# Patient Record
Sex: Male | Born: 1995 | Race: Black or African American | Hispanic: No | Marital: Single | Smoking: Never smoker
Health system: Southern US, Community
[De-identification: ages and names within clinical notes are randomized; demographics above are authoritative.]

## PROBLEM LIST (undated history)

## (undated) DIAGNOSIS — J45909 Unspecified asthma, uncomplicated: Secondary | ICD-10-CM

## (undated) HISTORY — DX: Unspecified asthma, uncomplicated: J45.909

## (undated) HISTORY — PX: NO PAST SURGERIES: SHX2092

---
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
## 2017-02-01 DIAGNOSIS — Z23 Encounter for immunization: Secondary | ICD-10-CM | POA: Diagnosis not present

## 2019-04-23 ENCOUNTER — Ambulatory Visit: Payer: HRSA Program | Attending: Internal Medicine

## 2019-04-23 DIAGNOSIS — Z20828 Contact with and (suspected) exposure to other viral communicable diseases: Secondary | ICD-10-CM | POA: Diagnosis not present

## 2019-04-23 DIAGNOSIS — Z20822 Contact with and (suspected) exposure to covid-19: Secondary | ICD-10-CM

## 2019-04-24 LAB — NOVEL CORONAVIRUS, NAA: SARS-CoV-2, NAA: NOT DETECTED

## 2019-08-22 ENCOUNTER — Ambulatory Visit: Payer: Self-pay | Attending: Internal Medicine

## 2019-08-22 DIAGNOSIS — Z23 Encounter for immunization: Secondary | ICD-10-CM

## 2019-08-22 NOTE — Progress Notes (Signed)
   Covid-19 Vaccination Clinic  Name:  Nicko Daher    MRN: [id number] DOB: 06/12/1995  08/22/2019  Mr. Danner was observed post Covid-19 immunization for 15 minutes without incident. He was provided with Vaccine Information Sheet and instruction to access the V-Safe system.   Mr. Venhuizen was instructed to call 911 with any severe reactions post vaccine: Marland Kitchen Difficulty breathing  . Swelling of face and throat  . A fast heartbeat  . A bad rash all over body  . Dizziness and weakness   Immunizations Administered    Name Date Dose VIS Date Route   Pfizer COVID-19 Vaccine 08/22/2019  2:30 PM 0.3 mL 06/25/2018 Intramuscular   Manufacturer: [employer], [employer]   Lot: [id number]   NDC: [address]-2

## 2019-09-13 ENCOUNTER — Ambulatory Visit: Payer: Self-pay | Attending: Internal Medicine

## 2019-09-13 DIAGNOSIS — Z23 Encounter for immunization: Secondary | ICD-10-CM

## 2019-09-13 NOTE — Progress Notes (Signed)
   Covid-19 Vaccination Clinic  Name:  [name]    MRN: [id number] DOB: 10-22-1995  09/13/2019  [name] was observed post Covid-19 immunization for 15 minutes without incident. He was provided with Vaccine Information Sheet and instruction to access the V-Safe system.   [name] was instructed to call 911 with any severe reactions post vaccine: Marland Kitchen Difficulty breathing  . Swelling of face and throat  . A fast heartbeat  . A bad rash all over body  . Dizziness and weakness   Immunizations Administered    Name Date Dose VIS Date Route   Pfizer COVID-19 Vaccine 09/13/2019 10:41 AM 0.3 mL 06/25/2018 Intramuscular   Manufacturer: [employer], [employer]   Lot: [id number]   NDC: [address]-9

## 2019-09-15 ENCOUNTER — Ambulatory Visit: Payer: Self-pay

## 2019-09-18 ENCOUNTER — Ambulatory Visit: Payer: Self-pay

## 2021-11-14 ENCOUNTER — Encounter: Payer: Self-pay | Admitting: Nurse Practitioner

## 2021-11-14 ENCOUNTER — Ambulatory Visit (INDEPENDENT_AMBULATORY_CARE_PROVIDER_SITE_OTHER): Payer: No Typology Code available for payment source | Admitting: Nurse Practitioner

## 2021-11-14 VITALS — BP 118/72 | HR 73 | Temp 96.5°F | Resp 10 | Ht 66.25 in | Wt 217.1 lb

## 2021-11-14 DIAGNOSIS — Z0000 Encounter for general adult medical examination without abnormal findings: Secondary | ICD-10-CM

## 2021-11-14 DIAGNOSIS — N5089 Other specified disorders of the male genital organs: Secondary | ICD-10-CM | POA: Diagnosis not present

## 2021-11-14 DIAGNOSIS — E6609 Other obesity due to excess calories: Secondary | ICD-10-CM | POA: Diagnosis not present

## 2021-11-14 DIAGNOSIS — Z6834 Body mass index (BMI) 34.0-34.9, adult: Secondary | ICD-10-CM

## 2021-11-14 NOTE — Progress Notes (Signed)
New Patient Office Visit  Subjective    Patient ID: Chad Fernandez, male    DOB: 07-Sep-1995  Age: 26 y.o. MRN: [id number]  CC:  Chief Complaint  Patient presents with   Establish Care    Previous PCP with Blair Heys at [facility].   Annual Exam    Has been told he was slightly anemic and had a slight elevation of cholesterol    HPI Chad Fernandez presents to establish care   Bariatric clinic in [place]: they have him on Diethylpropion. Sees them once a month.  States has been seeing them for approximately 1 month.  States he is doing well on medication.  Denies any adverse drug events.   for complete physical and follow up of chronic conditions.  Immunizations: -Tetanus:2018 -Influenza: out of season  -Covid-19: Pfizerx2 -Shingles: too young -Pneumonia: too young  -HPV: UTD  Diet: Fair diet. 2 meals ad ay with some snacking (2). Snacks will be healthy and unhealhty. Sugar free soda, powerade. Will drink some water. Exercise: Goes to the gym 3 times will do cardio for 30 mins.   Eye exam: Completes annually. Saw them last week. Wears glasses. [facility]. Dental exam: Completes semi-annually   Colonoscopy: Too young, currently average risk  Lung Cancer Screening: NA Dexa: NA  PSA: Dad had something with the prostate. Does not think it is cancer  Sleep: Works 3rd shift. States that he will 4 hours two times a day. Feels rested. Does not snore.  Lives with parents, Mom and dad. No siblings Not Sexually active but interested in women. Discussed safe sex practices.      Outpatient Encounter Medications as of 11/14/2021  Medication Sig   Diethylpropion HCl CR 75 MG TB24 Take 1 tablet by mouth daily.   [DISCONTINUED] amoxicillin (AMOXIL) 500 MG capsule Take 1 capsule (500 mg total) by mouth 3 (three) times daily.   [DISCONTINUED] cetirizine (ZYRTEC) 10 MG tablet Take 1 tablet (10 mg total) by mouth daily. One tab daily for allergies   [DISCONTINUED] ipratropium  (ATROVENT) 0.06 % nasal spray Place 2 sprays into both nostrils 4 (four) times daily.   No facility-administered encounter medications on file as of 11/14/2021.    Past Medical History:  Diagnosis Date   Asthma     Past Surgical History:  Procedure Laterality Date   NO PAST SURGERIES      Family History  Problem Relation Age of Onset   Obesity Mother    Anemia Mother    Asthma Father    Obesity Father    Diabetes Paternal Grandmother    Cataracts Paternal Grandmother     Social History   Socioeconomic History   Marital status: Single    Spouse name: Not on file   Number of children: 0   Years of education: Not on file   Highest education level: Bachelor's degree (e.g., BA, AB, BS)  Occupational History   Not on file  Tobacco Use   Smoking status: Never    Passive exposure: Never   Smokeless tobacco: Never  Vaping Use   Vaping Use: Never used  Substance and Sexual Activity   Alcohol use: Never   Drug use: Never   Sexual activity: Never  Other Topics Concern   Not on file  Social History Narrative   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  Respiratory:  Negative for shortness of breath.   Cardiovascular:  Negative for chest pain, palpitations and leg swelling.  Gastrointestinal:  Negative for abdominal pain, blood in stool, constipation, diarrhea, nausea and vomiting.       BM Every other day   Genitourinary:  Negative for dysuria and hematuria.  Neurological:  Negative for tingling and headaches.  Psychiatric/Behavioral:  Negative for hallucinations and suicidal ideas.         Objective    BP 118/72   Pulse  73   Temp (!) 96.5 F (35.8 C)   Resp 10   Ht 5' 6.25" (1.683 m)   Wt 217 lb 2 oz (98.5 kg)   SpO2 97%   BMI 34.78 kg/m   Physical Exam Vitals and nursing note reviewed. Exam conducted with a chaperone present [facility], RMA).  Constitutional:      Appearance: Normal appearance. He is obese.  HENT:     Right Ear: Tympanic membrane, ear canal and external ear normal.     Left Ear: Tympanic membrane, ear canal and external ear normal.     Mouth/Throat:     Mouth: Mucous membranes are moist.     Pharynx: Oropharynx is clear.  Eyes:     Extraocular Movements: Extraocular movements intact.     Pupils: Pupils are equal, round, and reactive to light.     Comments: Wears glasses  Cardiovascular:     Rate and Rhythm: Normal rate and regular rhythm.     Pulses: Normal pulses.     Heart sounds: Normal heart sounds.  Pulmonary:     Effort: Pulmonary effort is normal.     Breath sounds: Normal breath sounds.  Abdominal:     General: Bowel sounds are normal. There is no distension.     Palpations: There is no mass.     Tenderness: There is no abdominal tenderness.     Hernia: No hernia is present. There is no hernia in the left inguinal area or right inguinal area.  Genitourinary:    Penis: Normal and uncircumcised.      Testes:        Right: Mass present. Tenderness not present.        Left: Mass not present.     Epididymis:     Right: Normal.     Left: Normal.  Musculoskeletal:     Right lower leg: No edema.     Left lower leg: No edema.  Lymphadenopathy:     Cervical: No cervical adenopathy.     Lower Body: No right inguinal adenopathy. No left inguinal adenopathy.  Skin:    General: Skin is warm.  Neurological:     General: No focal deficit present.     Mental Status: He is alert.     Deep Tendon Reflexes:     Reflex Scores:      Bicep reflexes are 2+ on the right side and 2+ on the left side.      Patellar reflexes are 2+ on the right side and 2+ on the left  side.    Comments: Bilateral upper and lower extremity strength 5/5  Psychiatric:        Mood and Affect: Mood normal.  Behavior: Behavior normal.        Thought Content: Thought content normal.        Judgment: Judgment normal.         Assessment & Plan:   Problem List Items Addressed This Visit       Other   Scrotal mass    Incidental finding on exam.  Patient states sometimes it hurts sometimes does not.  He has not had it formally evaluated with ultrasound.  Did place order and give patient information to call to set up at [facility] imaging on [address]  Discussed the importance of getting ultrasound with his age to make sure it is not testicular cancer      Relevant Orders   US SCROTUM W/DOPPLER   Class 1 obesity due to excess calories without serious comorbidity with body mass index (BMI) of 34.0 to 34.9 in adult    Encouraged healthy lifestyle modifications.  Patient currently seeing bariatric clinic in [place].  Continue following up with them as scheduled continue take medication as prescribed by them.      Relevant Medications   Diethylpropion HCl CR 75 MG TB24   Other Relevant Orders   TSH   Lipid panel   Hemoglobin A1c   Preventative health care - Primary    Discussed age-appropriate immunizations and screening exams.      Relevant Orders   TSH   Lipid panel   Hemoglobin A1c   Comprehensive metabolic panel   CBC    Return in about 1 year (around 11/15/2022) for CPE and labs.   Audria Nine, NP

## 2021-11-14 NOTE — Assessment & Plan Note (Signed)
Discussed age-appropriate immunizations and screening exams. 

## 2021-11-14 NOTE — Assessment & Plan Note (Signed)
Encouraged healthy lifestyle modifications.  Patient currently seeing bariatric clinic in [place].  Continue following up with them as scheduled continue take medication as prescribed by them.

## 2021-11-14 NOTE — Patient Instructions (Signed)
Nice to see you today I will be in touch with the lab results once I have reviewed them Call the number I gave you and get the Ultrasound set up. Once I have the results I will be in touch with you. I want to see you in 1 year for your next physical, sooner if you need me

## 2021-11-14 NOTE — Assessment & Plan Note (Signed)
Incidental finding on exam.  Patient states sometimes it hurts sometimes does not.  He has not had it formally evaluated with ultrasound.  Did place order and give patient information to call to set up at [facility] imaging on [address]  Discussed the importance of getting ultrasound with his age to make sure it is not testicular cancer

## 2021-11-15 LAB — LIPID PANEL
Chol/HDL Ratio: 4.5 ratio (ref 0.0–5.0)
Cholesterol, Total: 162 mg/dL (ref 100–199)
HDL: 36 mg/dL — ABNORMAL LOW (ref >39)
LDL Chol Calc (NIH): 117 mg/dL — ABNORMAL HIGH (ref 0–99)
Triglycerides: 43 mg/dL (ref 0–149)
VLDL Cholesterol Cal: 9 mg/dL (ref 5–40)

## 2021-11-15 LAB — CBC
Hematocrit: 45 % (ref 37.5–51.0)
Hemoglobin: 14.4 g/dL (ref 13.0–17.7)
MCH: 22.8 pg — ABNORMAL LOW (ref 26.6–33.0)
MCHC: 32 g/dL (ref 31.5–35.7)
MCV: 71 fL — ABNORMAL LOW (ref 79–97)
Platelets: 237 10*3/uL (ref 150–450)
RBC: 6.31 x10E6/uL — ABNORMAL HIGH (ref 4.14–5.80)
RDW: 15.6 % — ABNORMAL HIGH (ref 11.6–15.4)
WBC: 3.9 10*3/uL (ref 3.4–10.8)

## 2021-11-15 LAB — COMPREHENSIVE METABOLIC PANEL
ALT: 27 IU/L (ref 0–44)
AST: 25 IU/L (ref 0–40)
Albumin/Globulin Ratio: 1.5 (ref 1.2–2.2)
Albumin: 4.6 g/dL (ref 4.3–5.2)
Alkaline Phosphatase: 56 IU/L (ref 44–121)
BUN/Creatinine Ratio: 12 (ref 9–20)
BUN: 13 mg/dL (ref 6–20)
Bilirubin Total: 0.4 mg/dL (ref 0.0–1.2)
CO2: 25 mmol/L (ref 20–29)
Calcium: 9.6 mg/dL (ref 8.7–10.2)
Chloride: 100 mmol/L (ref 96–106)
Creatinine, Ser: 1.05 mg/dL (ref 0.76–1.27)
Globulin, Total: 3 g/dL (ref 1.5–4.5)
Glucose: 89 mg/dL (ref 70–99)
Potassium: 4.2 mmol/L (ref 3.5–5.2)
Sodium: 140 mmol/L (ref 134–144)
Total Protein: 7.6 g/dL (ref 6.0–8.5)
eGFR: 102 mL/min/{1.73_m2} (ref >59)

## 2021-11-15 LAB — HEMOGLOBIN A1C
Est. average glucose Bld gHb Est-mCnc: 111 mg/dL
Hgb A1c MFr Bld: 5.5 % (ref 4.8–5.6)

## 2021-11-15 LAB — TSH: TSH: 2.07 u[IU]/mL (ref 0.450–4.500)

## 2021-11-19 ENCOUNTER — Other Ambulatory Visit: Payer: Self-pay

## 2021-11-19 ENCOUNTER — Encounter: Payer: Self-pay | Admitting: Emergency Medicine

## 2021-11-19 ENCOUNTER — Ambulatory Visit
Admission: EM | Admit: 2021-11-19 | Discharge: 2021-11-19 | Disposition: A | Discharge status: Home / Self Care | Payer: No Typology Code available for payment source | Attending: Family Medicine | Admitting: Family Medicine

## 2021-11-19 DIAGNOSIS — J0140 Acute pansinusitis, unspecified: Secondary | ICD-10-CM | POA: Diagnosis not present

## 2021-11-19 DIAGNOSIS — J45901 Unspecified asthma with (acute) exacerbation: Secondary | ICD-10-CM

## 2021-11-19 MED ORDER — ALBUTEROL SULFATE HFA 108 (90 BASE) MCG/ACT IN AERS: 2.0000 | INHALATION_SPRAY | RESPIRATORY_TRACT | 0 refills | Status: DC | PRN

## 2021-11-19 MED ORDER — AMOXICILLIN 875 MG PO TABS: 875.0000 mg | ORAL_TABLET | Freq: Two times a day (BID) | ORAL | 0 refills | Status: DC

## 2021-11-19 MED ORDER — ALBUTEROL SULFATE HFA 108 (90 BASE) MCG/ACT IN AERS
2.0000 | INHALATION_SPRAY | Freq: Once | RESPIRATORY_TRACT | Status: AC
  Administered 2021-11-19: 2 via RESPIRATORY_TRACT

## 2021-11-19 MED ORDER — PROMETHAZINE-DM 6.25-15 MG/5ML PO SYRP: 5.0000 mL | ORAL_SOLUTION | Freq: Three times a day (TID) | ORAL | 0 refills | Status: DC | PRN

## 2021-11-19 NOTE — ED Provider Notes (Signed)
Renaldo Fiddler    CSN: [id number] Arrival date & time: 11/19/21  1357      History   Chief Complaint Chief Complaint  Patient presents with   Cough    HPI Chad Fernandez is a 26 y.o. male.   HPI Patient with a history of asthma presents today with cough which has been occasionally productive, severe nasal congestion, runny nose x10 days.  He has taken multiple over-the-counter medications without relief of symptoms.  He denies any chest pain or shortness of breath. Endorses mild wheezing throughout the course of current illness. No known sick contacts.  Past Medical History:  Diagnosis Date   Asthma     Patient Active Problem List   Diagnosis Date Noted   Scrotal mass 11/14/2021   Class 1 obesity due to excess calories without serious comorbidity with body mass index (BMI) of 34.0 to 34.9 in adult 11/14/2021   Preventative health care 11/14/2021    Past Surgical History:  Procedure Laterality Date   NO PAST SURGERIES         Home Medications    Prior to Admission medications   Medication Sig Start Date End Date Taking? Authorizing Provider  albuterol (VENTOLIN HFA) 108 (90 Base) MCG/ACT inhaler Inhale 2 puffs into the lungs every 4 (four) hours as needed for wheezing or shortness of breath. 11/19/21  Yes Bing Neighbors, FNP  amoxicillin (AMOXIL) 875 MG tablet Take 1 tablet (875 mg total) by mouth 2 (two) times daily. 11/19/21  Yes Bing Neighbors, FNP  Diethylpropion HCl CR 75 MG TB24 Take 1 tablet by mouth daily. 10/24/21  Yes [provider]  promethazine-dextromethorphan (PROMETHAZINE-DM) 6.25-15 MG/5ML syrup Take 5 mLs by mouth 3 (three) times daily as needed for cough. 11/19/21  Yes Bing Neighbors, FNP    Family History Family History  Problem Relation Age of Onset   Obesity Mother    Anemia Mother    Asthma Father    Obesity Father    Diabetes Paternal Grandmother    Cataracts Paternal Grandmother     Social History Social  History   Tobacco Use   Smoking status: Never    Passive exposure: Never   Smokeless tobacco: Never  Vaping Use   Vaping Use: Never used  Substance Use Topics   Alcohol use: Never   Drug use: Never     Allergies   Patient has no known allergies.   Review of Systems Review of Systems Pertinent negatives listed in HPI   Physical Exam Triage Vital Signs ED Triage Vitals  Enc Vitals Group     BP 11/19/21 1450 (!) 143/76     Pulse Rate 11/19/21 1450 76     Resp 11/19/21 1450 16     Temp 11/19/21 1450 98.2 F (36.8 C)     Temp Source 11/19/21 1450 Oral     SpO2 11/19/21 1450 98 %     Weight --      Height --      Head Circumference --      Peak Flow --      Pain Score 11/19/21 1452 0     Pain Loc --      Pain Edu? --      Excl. in GC? --    No data found.  Updated Vital Signs BP (!) 143/76 (BP Location: Right Arm)   Pulse 76   Temp 98.2 F (36.8 C) (Oral)   Resp 16   SpO2 98%  Visual Acuity Right Eye Distance:   Left Eye Distance:   Bilateral Distance:    Right Eye Near:   Left Eye Near:    Bilateral Near:     Physical Exam  General Appearance:    Alert, cooperative, no distress  HENT:   Normocephalic, ears normal, nares mucosal edema with congestion, rhinorrhea, oropharynx without erythema or exudate    Eyes:    PERRL, conjunctiva/corneas clear, EOM's intact       Lungs:     Clear to auscultation bilaterally, respirations unlabored  Heart:    Regular rate and rhythm  Neurologic:   Awake, alert, oriented x 3. No apparent focal neurological           defect.     UC Treatments / Results  Labs (all labs ordered are listed, but only abnormal results are displayed) Labs Reviewed - No data to display  EKG   Radiology No results found.  Procedures Procedures (including critical care time)  Medications Ordered in UC Medications  albuterol (VENTOLIN HFA) 108 (90 Base) MCG/ACT inhaler 2 puff (has no administration in time range)    Initial  Impression / Assessment and Plan / UC Course  I have reviewed the triage vital signs and the nursing notes.  Pertinent labs & imaging results that were available during my care of the patient were reviewed by me and considered in my medical decision making (see chart for details).    Treatment per discharge instructions and medication orders. Continue to hydrate well with fluids. Return here or follow-up with primary care provider symptoms worsen or do not readily improve.   Final Clinical Impressions(s) / UC Diagnoses   Final diagnoses:  Acute non-recurrent pansinusitis  Asthma with acute exacerbation, unspecified asthma severity, unspecified whether persistent     Discharge Instructions      Take medication as prescribed.  I have prescribed you an albuterol inhaler for management of wheezing, chest tightness or shortness of breath. If any of your symptoms worsen or do not readily improve follow-up with your primary care provider return for evaluation.     ED Prescriptions     Medication Sig Dispense Auth. Provider   amoxicillin (AMOXIL) 875 MG tablet Take 1 tablet (875 mg total) by mouth 2 (two) times daily. 20 tablet Bing Neighbors, FNP   promethazine-dextromethorphan (PROMETHAZINE-DM) 6.25-15 MG/5ML syrup Take 5 mLs by mouth 3 (three) times daily as needed for cough. 140 mL Bing Neighbors, FNP   albuterol (VENTOLIN HFA) 108 (90 Base) MCG/ACT inhaler Inhale 2 puffs into the lungs every 4 (four) hours as needed for wheezing or shortness of breath. 1 each Bing Neighbors, FNP      PDMP not reviewed this encounter.   Bing Neighbors, FNP 11/19/21 1517

## 2021-11-19 NOTE — ED Triage Notes (Signed)
Patient productive cough w/ " white" sputum x 10 days.   Patient denies fever or SOB.   Patient endorses nasal congestion.   Patient endorses runny.   Patient has taken Robitussin and Mucinex with no relief of symptoms.

## 2021-11-19 NOTE — Discharge Instructions (Addendum)
Take medication as prescribed.  I have prescribed you an albuterol inhaler for management of wheezing, chest tightness or shortness of breath. If any of your symptoms worsen or do not readily improve follow-up with your primary care provider return for evaluation.

## 2021-11-21 ENCOUNTER — Other Ambulatory Visit: Payer: No Typology Code available for payment source

## 2021-11-24 ENCOUNTER — Ambulatory Visit
Admission: RE | Admit: 2021-11-24 | Discharge: 2021-11-24 | Disposition: A | Discharge status: Home / Self Care | Payer: No Typology Code available for payment source | Source: Ambulatory Visit | Attending: Nurse Practitioner | Admitting: Nurse Practitioner

## 2021-11-24 DIAGNOSIS — N5089 Other specified disorders of the male genital organs: Secondary | ICD-10-CM

## 2022-01-12 ENCOUNTER — Other Ambulatory Visit (HOSPITAL_BASED_OUTPATIENT_CLINIC_OR_DEPARTMENT_OTHER): Payer: Self-pay

## 2022-01-12 MED ORDER — OZEMPIC (2 MG/DOSE) 8 MG/3ML ~~LOC~~ SOPN
2.0000 mg | PEN_INJECTOR | SUBCUTANEOUS | 0 refills | Status: DC
  Filled 2022-01-12: qty 3, 28d supply, fill #0

## 2022-01-13 ENCOUNTER — Other Ambulatory Visit (HOSPITAL_BASED_OUTPATIENT_CLINIC_OR_DEPARTMENT_OTHER): Payer: Self-pay

## 2022-01-13 MED ORDER — OZEMPIC (0.25 OR 0.5 MG/DOSE) 2 MG/3ML ~~LOC~~ SOPN
PEN_INJECTOR | SUBCUTANEOUS | 0 refills | Status: DC
  Filled 2022-01-13: qty 3, 28d supply, fill #0

## 2022-01-13 MED ORDER — MOUNJARO 2.5 MG/0.5ML ~~LOC~~ SOAJ
2.5000 mg | SUBCUTANEOUS | 0 refills | Status: DC
  Filled 2022-01-13 (×2): qty 2, 28d supply, fill #0

## 2022-01-26 ENCOUNTER — Other Ambulatory Visit (HOSPITAL_BASED_OUTPATIENT_CLINIC_OR_DEPARTMENT_OTHER): Payer: Self-pay

## 2022-04-07 ENCOUNTER — Encounter: Payer: Self-pay | Admitting: Nurse Practitioner

## 2022-04-07 ENCOUNTER — Ambulatory Visit (INDEPENDENT_AMBULATORY_CARE_PROVIDER_SITE_OTHER): Payer: No Typology Code available for payment source | Admitting: Nurse Practitioner

## 2022-04-07 VITALS — BP 118/72 | HR 70 | Temp 97.8°F | Ht 66.25 in | Wt 199.0 lb

## 2022-04-07 DIAGNOSIS — L600 Ingrowing nail: Secondary | ICD-10-CM | POA: Insufficient documentation

## 2022-04-07 NOTE — Assessment & Plan Note (Signed)
Ingrown toenail without obvious signs of infection.  Ambulatory referral to podiatry placed today.  Signs and symptoms reviewed when he needs to be reevaluated or reach out to clinic.

## 2022-04-07 NOTE — Progress Notes (Signed)
   Acute Office Visit  Subjective:     Patient ID: Chad Fernandez, male    DOB: Jul 15, 1995, 26 y.o.   MRN: [id number]  Chief Complaint  Patient presents with   Ingrown Toenail    Left great toenail area is red, swollen, slightly painful at times. Wasn't sure if insurance would require a referral to a specialist.    HPI Patient is in today for toe issue  States that the problem has been going on for approx 2 weeks. Intermittent for the past few months. States that it has been the same toe States history of the same thing but other foot Left great toe. States that there is some pain. States that no redness warmth or discharge. Would like to see podiatry in [place].  Review of Systems  Constitutional:  Negative for chills and fever.  Neurological:  Positive for tingling (toes, intermtittent).        Objective:    BP 118/72 (BP Location: Left Arm, Patient Position: Sitting, Cuff Size: Large)   Pulse 70   Temp 97.8 F (36.6 C)   Ht 5' 6.25" (1.683 m)   Wt 199 lb (90.3 kg)   SpO2 97%   BMI 31.88 kg/m    Physical Exam Vitals and nursing note reviewed.  Constitutional:      Appearance: Normal appearance.  Cardiovascular:     Rate and Rhythm: Normal rate and regular rhythm.  Pulmonary:     Breath sounds: Normal breath sounds.  Musculoskeletal:       Feet:  Neurological:     Mental Status: He is alert.     No results found for any visits on 04/07/22.      Assessment & Plan:   Problem List Items Addressed This Visit       Musculoskeletal and Integument   Ingrown toenail of left foot - Primary    Ingrown toenail without obvious signs of infection.  Ambulatory referral to podiatry placed today.  Signs and symptoms reviewed when he needs to be reevaluated or reach out to clinic.      Relevant Orders   Ambulatory referral to Podiatry    No orders of the defined types were placed in this encounter.   Return if symptoms worsen or fail to improve.  Audria Nine, NP

## 2022-04-07 NOTE — Patient Instructions (Signed)
Nice to see you today I do not feel that it is infected currently If it gets really, red, swollen, or starts having yellow, green or red discharge let me know I have referred you to a podiatrist

## 2022-04-10 ENCOUNTER — Ambulatory Visit: Payer: Self-pay | Admitting: Podiatry

## 2022-04-10 ENCOUNTER — Ambulatory Visit (INDEPENDENT_AMBULATORY_CARE_PROVIDER_SITE_OTHER): Payer: No Typology Code available for payment source | Admitting: Podiatry

## 2022-04-10 ENCOUNTER — Encounter: Payer: Self-pay | Admitting: Podiatry

## 2022-04-10 VITALS — BP 131/71 | HR 83

## 2022-04-10 DIAGNOSIS — L600 Ingrowing nail: Secondary | ICD-10-CM | POA: Diagnosis not present

## 2022-04-10 NOTE — Progress Notes (Signed)
   Chief Complaint  Patient presents with   Ingrown Toenail    Patient is here for left foot great toe ingrown toe nail.patient states that he has had pain for 3 weeks.    Subjective: Patient presents today for evaluation of pain to the lateral border left great toe. Patient is concerned for possible ingrown nail.  It is very sensitive to touch.  Patient presents today for further treatment and evaluation.  Past Medical History:  Diagnosis Date   Asthma     Objective:  General: Well developed, nourished, in no acute distress, alert and oriented x3   Dermatology: Skin is warm, dry and supple bilateral.  Lateral border left great toe is tender with evidence of an ingrowing nail. Pain on palpation noted to the border of the nail fold. The remaining nails appear unremarkable at this time. There are no open sores, lesions.  Vascular: DP and PT pulses palpable.  No clinical evidence of vascular compromise  Neruologic: Grossly intact via light touch bilateral.  Musculoskeletal: No pedal deformity noted  Assesement: #1 Paronychia with ingrowing nail lateral border left great toe  Plan of Care:  1. Patient evaluated.  2. Discussed treatment alternatives and plan of care. Explained nail avulsion procedure and post procedure course to patient. 3. Patient opted for permanent partial nail avulsion of the ingrown portion of the nail.  4. Prior to procedure, local anesthesia infiltration utilized using 3 ml of a 50:50 mixture of 2% plain lidocaine and 0.5% plain marcaine in a normal hallux block fashion and a betadine prep performed.  5. Partial permanent nail avulsion with chemical matrixectomy performed using 3x30sec applications of phenol followed by alcohol flush.  6. Light dressing applied.  Post care instructions provided 7.   Return to clinic 3 weeks  Felecia Shelling, DPM [facility]  Dr. Felecia Shelling, DPM    [address] [place], [place] [address]                Office [phone]  Fax [phone]

## 2022-05-08 ENCOUNTER — Ambulatory Visit (INDEPENDENT_AMBULATORY_CARE_PROVIDER_SITE_OTHER): Payer: 59 | Admitting: Podiatry

## 2022-05-08 VITALS — BP 114/67 | HR 74

## 2022-05-08 DIAGNOSIS — L600 Ingrowing nail: Secondary | ICD-10-CM

## 2022-05-08 NOTE — Progress Notes (Signed)
   Chief Complaint  Patient presents with   Ingrown Toenail    Left foot hallux ingrown Follow-up     Subjective: 27 y.o. male presents today status post permanent nail avulsion procedure of the lateral border of the left great toe that was performed on 04/10/2022.  Patient doing well.  No new complaints at this time.   Past Medical History:  Diagnosis Date   Asthma     Objective: Neurovascular status intact.  Skin is warm, dry and supple. Nail and respective nail fold appears to be healing appropriately.   Assessment: #1 s/p partial permanent nail matrixectomy lateral border left great toe   Plan of care: #1 patient was evaluated  #2 light debridement of the periungual debris was performed to the border of the respective toe and nail plate using a tissue nipper. #3 patient is to return to clinic on a PRN basis.   Edrick Kins, DPM [facility]  Dr. Edrick Kins, DPM    [address], [place] [address]                Office [phone]  Fax [phone]

## 2022-09-01 ENCOUNTER — Ambulatory Visit: Payer: 59 | Admitting: Podiatry

## 2022-09-04 ENCOUNTER — Ambulatory Visit: Payer: 59 | Admitting: Podiatry

## 2022-11-20 ENCOUNTER — Encounter: Payer: Self-pay | Admitting: Nurse Practitioner

## 2022-11-20 ENCOUNTER — Ambulatory Visit (INDEPENDENT_AMBULATORY_CARE_PROVIDER_SITE_OTHER): Payer: 59 | Admitting: Nurse Practitioner

## 2022-11-20 VITALS — BP 110/74 | HR 88 | Temp 98.7°F | Ht 66.0 in | Wt 177.0 lb

## 2022-11-20 DIAGNOSIS — E7800 Pure hypercholesterolemia, unspecified: Secondary | ICD-10-CM | POA: Diagnosis not present

## 2022-11-20 DIAGNOSIS — Z0000 Encounter for general adult medical examination without abnormal findings: Secondary | ICD-10-CM | POA: Diagnosis not present

## 2022-11-20 DIAGNOSIS — Z114 Encounter for screening for human immunodeficiency virus [HIV]: Secondary | ICD-10-CM | POA: Diagnosis not present

## 2022-11-20 DIAGNOSIS — Z1159 Encounter for screening for other viral diseases: Secondary | ICD-10-CM

## 2022-11-20 DIAGNOSIS — E663 Overweight: Secondary | ICD-10-CM

## 2022-11-20 NOTE — Progress Notes (Signed)
Established Patient Office Visit  Subjective   Patient ID: Chad Fernandez, male    DOB: Jan 19, 1996  Age: 27 y.o. MRN: [id number]  Chief Complaint  Patient presents with   Annual Exam    HPI  for complete physical and follow up of chronic conditions.  Immunizations: -Tetanus: Completed in 2018 -Influenza: Completed this season -Shingles:  too young -Pneumonia: Too young -HPV: Up-to-date -COVID-19: Up-to-date  Diet: Fair diet. States that he is doing 2 meals a day with no snacking. States that he drinks water and diet soda and juice.  Exercise:  3 times a week. 30 mins at a time. States that he does cardio and strength rainding   Eye exam: Completes annually. Wears glasses  Dental exam: Completes semi-annually    Colonoscopy: Too young, currently average risk Lung Cancer Screening: N/A  PSA: Too young, currently average risk  Sleep:4 hours, twice a day.  Patient works third shift.  Does feel rested. Does not snore       Review of Systems  Constitutional:  Negative for chills and fever.  Respiratory:  Negative for shortness of breath.   Cardiovascular:  Negative for chest pain and leg swelling.  Gastrointestinal:  Negative for abdominal pain, blood in stool, constipation, diarrhea, nausea and vomiting.       BM every other day   Genitourinary:  Negative for dysuria and hematuria.  Neurological:  Negative for tingling and headaches.  Psychiatric/Behavioral:  Negative for hallucinations and suicidal ideas.       Objective:     BP 110/74   Pulse 88   Temp 98.7 F (37.1 C) (Temporal)   Ht 5\' 6"  (1.676 m)   Wt 177 lb (80.3 kg)   SpO2 97%   BMI 28.57 kg/m  BP Readings from Last 3 Encounters:  11/20/22 110/74  05/08/22 114/67  04/10/22 131/71   Wt Readings from Last 3 Encounters:  11/20/22 177 lb (80.3 kg)  04/07/22 199 lb (90.3 kg)  11/14/21 217 lb 2 oz (98.5 kg)      Physical Exam Vitals and nursing note reviewed. Exam conducted with a chaperone  present Barnie Mort, CMA).  Constitutional:      Appearance: Normal appearance.  HENT:     Right Ear: Tympanic membrane, ear canal and external ear normal.     Left Ear: Tympanic membrane, ear canal and external ear normal.     Mouth/Throat:     Mouth: Mucous membranes are moist.     Pharynx: Oropharynx is clear.  Eyes:     Extraocular Movements: Extraocular movements intact.     Pupils: Pupils are equal, round, and reactive to light.  Cardiovascular:     Rate and Rhythm: Normal rate and regular rhythm.     Pulses: Normal pulses.     Heart sounds: Normal heart sounds.  Pulmonary:     Effort: Pulmonary effort is normal.     Breath sounds: Normal breath sounds.  Abdominal:     General: Bowel sounds are normal. There is no distension.     Palpations: There is no mass.     Tenderness: There is no abdominal tenderness.     Hernia: No hernia is present. There is no hernia in the left inguinal area or right inguinal area.  Genitourinary:    Penis: Normal.      Testes: Normal.     Epididymis:     Right: Normal.     Left: Normal.    Musculoskeletal:  Right lower leg: No edema.     Left lower leg: No edema.  Lymphadenopathy:     Cervical: No cervical adenopathy.     Lower Body: No right inguinal adenopathy. No left inguinal adenopathy.  Skin:    General: Skin is warm.  Neurological:     General: No focal deficit present.     Mental Status: He is alert.     Deep Tendon Reflexes:     Reflex Scores:      Bicep reflexes are 2+ on the right side and 2+ on the left side.      Patellar reflexes are 2+ on the right side and 2+ on the left side.    Comments: Bilateral upper and lower extremity strength 5/5  Psychiatric:        Mood and Affect: Mood normal.        Behavior: Behavior normal.        Thought Content: Thought content normal.        Judgment: Judgment normal.      No results found for any visits on 11/20/22.    The ASCVD Risk score (Arnett DK, et al.,  2019) failed to calculate for the following reasons:   The 2019 ASCVD risk score is only valid for ages 77 to 31    Assessment & Plan:   Problem List Items Addressed This Visit       Other   Preventative health care - Primary    Discussed age-appropriate immunizations and screening exams.  Did review patient's personal, surgical, social, family histories.  Patient is up-to-date with all age-appropriate immunizations.  Patient is too young for CRC screening or prostate cancer screening.  Patient was given information at discharge about preventative healthcare maintenance with anticipatory guidance.      Relevant Orders   CBC   Comprehensive metabolic panel   TSH   Overweight    Patient has lost approximately 40 pounds since last physical.  Continue working on healthy lifestyle modifications and exercise keep up the good work      Relevant Orders   Hemoglobin A1c   Elevated LDL cholesterol level    History of very mild elevated LDL anticipate normal lipids with weight loss.      Relevant Orders   Hemoglobin A1c   Lipid panel   Other Visit Diagnoses     Encounter for hepatitis C screening test for low risk patient       Relevant Orders   Hepatitis C Antibody   Encounter for HIV (human immunodeficiency virus) test       Relevant Orders   HIV antibody (with reflex)       Return in about 1 year (around 11/20/2023) for CPE and Labs.    Audria Nine, NP

## 2022-11-20 NOTE — Assessment & Plan Note (Signed)
Patient has lost approximately 40 pounds since last physical.  Continue working on healthy lifestyle modifications and exercise keep up the good work

## 2022-11-20 NOTE — Assessment & Plan Note (Signed)
Discussed age-appropriate immunizations and screening exams.  Did review patient's personal, surgical, social, family histories.  Patient is up-to-date with all age-appropriate immunizations.  Patient is too young for CRC screening or prostate cancer screening.  Patient was given information at discharge about preventative healthcare maintenance with anticipatory guidance.

## 2022-11-20 NOTE — Patient Instructions (Signed)
Nice to see you today I will be in touch with the labs once I have reviewed them Follow up with me in 1 year for your next physical, sooner if you need me

## 2022-11-20 NOTE — Assessment & Plan Note (Signed)
History of very mild elevated LDL anticipate normal lipids with weight loss.

## 2022-11-21 LAB — LIPID PANEL
Chol/HDL Ratio: 4.1 ratio (ref 0.0–5.0)
Cholesterol, Total: 215 mg/dL — ABNORMAL HIGH (ref 100–199)
HDL: 53 mg/dL (ref >39)
LDL Chol Calc (NIH): 153 mg/dL — ABNORMAL HIGH (ref 0–99)
Triglycerides: 52 mg/dL (ref 0–149)
VLDL Cholesterol Cal: 9 mg/dL (ref 5–40)

## 2022-11-21 LAB — COMPREHENSIVE METABOLIC PANEL
ALT: 25 IU/L (ref 0–44)
AST: 22 IU/L (ref 0–40)
Albumin: 4.6 g/dL (ref 4.3–5.2)
Alkaline Phosphatase: 56 IU/L (ref 44–121)
BUN/Creatinine Ratio: 11 (ref 9–20)
BUN: 12 mg/dL (ref 6–20)
Bilirubin Total: 1 mg/dL (ref 0.0–1.2)
CO2: 25 mmol/L (ref 20–29)
Calcium: 9.6 mg/dL (ref 8.7–10.2)
Chloride: 101 mmol/L (ref 96–106)
Creatinine, Ser: 1.06 mg/dL (ref 0.76–1.27)
Globulin, Total: 3 g/dL (ref 1.5–4.5)
Glucose: 79 mg/dL (ref 70–99)
Potassium: 4.2 mmol/L (ref 3.5–5.2)
Sodium: 141 mmol/L (ref 134–144)
Total Protein: 7.6 g/dL (ref 6.0–8.5)
eGFR: 101 mL/min/{1.73_m2} (ref >59)

## 2022-11-21 LAB — HEMOGLOBIN A1C
Est. average glucose Bld gHb Est-mCnc: 111 mg/dL
Hgb A1c MFr Bld: 5.5 % (ref 4.8–5.6)

## 2022-11-21 LAB — TSH: TSH: 1.92 u[IU]/mL (ref 0.450–4.500)

## 2022-11-21 LAB — CBC
Hematocrit: 48.9 % (ref 37.5–51.0)
Hemoglobin: 15.5 g/dL (ref 13.0–17.7)
MCH: 23.4 pg — ABNORMAL LOW (ref 26.6–33.0)
MCHC: 31.7 g/dL (ref 31.5–35.7)
MCV: 74 fL — ABNORMAL LOW (ref 79–97)
Platelets: 267 10*3/uL (ref 150–450)
RBC: 6.63 x10E6/uL — ABNORMAL HIGH (ref 4.14–5.80)
RDW: 16.2 % — ABNORMAL HIGH (ref 11.6–15.4)
WBC: 5.3 10*3/uL (ref 3.4–10.8)

## 2022-11-21 LAB — SPECIMEN STATUS REPORT

## 2022-11-21 LAB — HEPATITIS C ANTIBODY: Hep C Virus Ab: NONREACTIVE

## 2023-10-20 ENCOUNTER — Ambulatory Visit
Admission: EM | Admit: 2023-10-20 | Discharge: 2023-10-20 | Disposition: A | Discharge status: Home / Self Care | Attending: Emergency Medicine | Admitting: Emergency Medicine

## 2023-10-20 DIAGNOSIS — J069 Acute upper respiratory infection, unspecified: Secondary | ICD-10-CM | POA: Diagnosis not present

## 2023-10-20 MED ORDER — PROMETHAZINE-DM 6.25-15 MG/5ML PO SYRP: 5.0000 mL | ORAL_SOLUTION | Freq: Every evening | ORAL | 0 refills | Status: DC | PRN

## 2023-10-20 MED ORDER — BENZONATATE 100 MG PO CAPS: 100.0000 mg | ORAL_CAPSULE | Freq: Three times a day (TID) | ORAL | 0 refills | Status: DC

## 2023-10-20 MED ORDER — AZITHROMYCIN 250 MG PO TABS: 250.0000 mg | ORAL_TABLET | Freq: Every day | ORAL | 0 refills | Status: DC

## 2023-10-20 MED ORDER — PREDNISONE 10 MG (21) PO TBPK: ORAL_TABLET | Freq: Every day | ORAL | 0 refills | Status: DC

## 2023-10-20 MED ORDER — ALBUTEROL SULFATE HFA 108 (90 BASE) MCG/ACT IN AERS: 2.0000 | INHALATION_SPRAY | RESPIRATORY_TRACT | 1 refills | Status: DC | PRN

## 2023-10-20 NOTE — ED Provider Notes (Signed)
 Chad Fernandez    CSN: [id number] Arrival date & time: 10/20/23  1422      History   Chief Complaint Chief Complaint  Patient presents with   Cough    HPI Chad Fernandez is a 28 y.o. male.   Patient presents for evaluation of nasal congestion, a tickle to the throat, a productive cough, shortness of breath and wheezing with coughing present for 5 days..  Has attempted use of Robitussin, stopped as it was causing diarrhea and switched to DayQuil.  No known sick contacts prior.  Tolerating food and liquids.  History of asthma but no inhaler available.  Past Medical History:  Diagnosis Date   Asthma     Patient Active Problem List   Diagnosis Date Noted   Overweight 11/20/2022   Elevated LDL cholesterol level 11/20/2022   Ingrown toenail of left foot 04/07/2022   Scrotal mass 11/14/2021   Class 1 obesity due to excess calories without serious comorbidity with body mass index (BMI) of 34.0 to 34.9 in adult 11/14/2021   Preventative health care 11/14/2021   Asthma 05/27/2009   Allergic rhinitis 04/27/2009    Past Surgical History:  Procedure Laterality Date   NO PAST SURGERIES         Home Medications    Prior to Admission medications   Medication Sig Start Date End Date Taking? Authorizing Provider  albuterol  (VENTOLIN  HFA) 108 (90 Base) MCG/ACT inhaler Inhale 2 puffs into the lungs every 4 (four) hours as needed for wheezing or shortness of breath. 10/20/23  Yes Lanyah Spengler R, NP  azithromycin (ZITHROMAX) 250 MG tablet Take 1 tablet (250 mg total) by mouth daily. Take first 2 tablets together, then 1 every day until finished. 10/20/23  Yes Altonio Schwertner R, NP  benzonatate (TESSALON) 100 MG capsule Take 1 capsule (100 mg total) by mouth every 8 (eight) hours. 10/20/23  Yes Adelheid Hoggard R, NP  predniSONE (STERAPRED UNI-PAK 21 TAB) 10 MG (21) TBPK tablet Take by mouth daily. Take 6 tabs by mouth daily  for 1 days, then 5 tabs for 1 days, then 4 tabs for 1  days, then 3 tabs for 1 days, 2 tabs for 1 days, then 1 tab by mouth daily for 1 days 10/20/23  Yes Jameil Whitmoyer R, NP  promethazine -dextromethorphan (PROMETHAZINE -DM) 6.25-15 MG/5ML syrup Take 5 mLs by mouth at bedtime as needed. 10/20/23  Yes Kazim Corrales, Shelba SAUNDERS, NP    Family History Family History  Problem Relation Age of Onset   Obesity Mother    Anemia Mother    Asthma Father    Obesity Father    Diabetes Paternal Grandmother    Cataracts Paternal Grandmother     Social History Social History   Tobacco Use   Smoking status: Never    Passive exposure: Never   Smokeless tobacco: Never  Vaping Use   Vaping status: Never Used  Substance Use Topics   Alcohol use: Never   Drug use: Never     Allergies   Patient has no known allergies.   Review of Systems Review of Systems  Respiratory:  Positive for cough.      Physical Exam Triage Vital Signs ED Triage Vitals  Encounter Vitals Group     BP 10/20/23 1438 130/81     Girls Systolic BP Percentile --      Girls Diastolic BP Percentile --      Boys Systolic BP Percentile --      Boys Diastolic BP Percentile --  Pulse Rate 10/20/23 1438 65     Resp 10/20/23 1438 16     Temp 10/20/23 1438 97.8 F (36.6 C)     Temp Source 10/20/23 1438 Temporal     SpO2 10/20/23 1438 98 %     Weight --      Height --      Head Circumference --      Peak Flow --      Pain Score 10/20/23 1432 0     Pain Loc --      Pain Education --      Exclude from Growth Chart --    No data found.  Updated Vital Signs BP 130/81 (BP Location: Left Arm)   Pulse 65   Temp 97.8 F (36.6 C) (Temporal)   Resp 16   SpO2 98%   Visual Acuity Right Eye Distance:   Left Eye Distance:   Bilateral Distance:    Right Eye Near:   Left Eye Near:    Bilateral Near:     Physical Exam Constitutional:      Appearance: Normal appearance.  HENT:     Head: Normocephalic.     Right Ear: Tympanic membrane, ear canal and external ear normal.      Left Ear: Tympanic membrane, ear canal and external ear normal.     Nose: Congestion present.     Mouth/Throat:     Pharynx: No oropharyngeal exudate or posterior oropharyngeal erythema.   Eyes:     Extraocular Movements: Extraocular movements intact.    Cardiovascular:     Rate and Rhythm: Normal rate and regular rhythm.     Pulses: Normal pulses.     Heart sounds: Normal heart sounds.  Pulmonary:     Effort: Pulmonary effort is normal.     Breath sounds: Normal breath sounds.   Neurological:     Mental Status: He is alert and oriented to person, place, and time. Mental status is at baseline.      UC Treatments / Results  Labs (all labs ordered are listed, but only abnormal results are displayed) Labs Reviewed - No data to display  EKG   Radiology No results found.  Procedures Procedures (including critical care time)  Medications Ordered in UC Medications - No data to display  Initial Impression / Assessment and Plan / UC Course  I have reviewed the triage vital signs and the nursing notes.  Pertinent labs & imaging results that were available during my care of the patient were reviewed by me and considered in my medical decision making (see chart for details).  Acute URI  Patient is in no signs of distress nor toxic appearing.  Vital signs are stable.  Low suspicion for pneumonia, pneumothorax or bronchitis and therefore will defer imaging.  Prescribed azithromycin, prednisone, Tessalon and Promethazine  DM as asthma most likely flared. May use additional over-the-counter medications as needed for supportive care.  May follow-up with urgent care as needed if symptoms persist or worsen.   Final Clinical Impressions(s) / UC Diagnoses   Final diagnoses:  Acute URI     Discharge Instructions      Begin azithromycin as directed for bacterial coverage  Begin prednisone every morning with food as directed to open and relax the airway, should help with  shortness of breath and wheezing, may use albuterol  inhaler taking 2 puffs every 4 hours in addition to this  You may use Tessalon pill as needed for cough, may use cough syrup at bedtime  to allow for rest    You can take Tylenol and/or Ibuprofen as needed for fever reduction and pain relief.   For cough: honey 1/2 to 1 teaspoon (you can dilute the honey in water or another fluid).  You can also use guaifenesin and dextromethorphan for cough. You can use a humidifier for chest congestion and cough.  If you don't have a humidifier, you can sit in the bathroom with the hot shower running.      For sore throat: try warm salt water gargles, cepacol lozenges, throat spray, warm tea or water with lemon/honey, popsicles or ice, or OTC cold relief medicine for throat discomfort.   For congestion: take a daily anti-histamine like Zyrtec , Claritin, and a oral decongestant, such as pseudoephedrine.  You can also use Flonase 1-2 sprays in each nostril daily.   It is important to stay hydrated: drink plenty of fluids (water, gatorade/powerade/pedialyte, juices, or teas) to keep your throat moisturized and help further relieve irritation/discomfort.    ED Prescriptions     Medication Sig Dispense Auth. Provider   azithromycin (ZITHROMAX) 250 MG tablet Take 1 tablet (250 mg total) by mouth daily. Take first 2 tablets together, then 1 every day until finished. 6 tablet Williard Keller R, NP   predniSONE (STERAPRED UNI-PAK 21 TAB) 10 MG (21) TBPK tablet Take by mouth daily. Take 6 tabs by mouth daily  for 1 days, then 5 tabs for 1 days, then 4 tabs for 1 days, then 3 tabs for 1 days, 2 tabs for 1 days, then 1 tab by mouth daily for 1 days 21 tablet Arend Bahl R, NP   benzonatate (TESSALON) 100 MG capsule Take 1 capsule (100 mg total) by mouth every 8 (eight) hours. 21 capsule Chistine Dematteo R, NP   promethazine -dextromethorphan (PROMETHAZINE -DM) 6.25-15 MG/5ML syrup Take 5 mLs by mouth at bedtime as  needed. 118 mL Jenesis Suchy R, NP   albuterol  (VENTOLIN  HFA) 108 (90 Base) MCG/ACT inhaler Inhale 2 puffs into the lungs every 4 (four) hours as needed for wheezing or shortness of breath. 8 g Teresa Shelba SAUNDERS, NP      PDMP not reviewed this encounter.   Teresa Shelba SAUNDERS, NP 10/20/23 1544

## 2023-10-20 NOTE — Discharge Instructions (Signed)
 Begin azithromycin as directed for bacterial coverage  Begin prednisone every morning with food as directed to open and relax the airway, should help with shortness of breath and wheezing, may use albuterol  inhaler taking 2 puffs every 4 hours in addition to this  You may use Tessalon pill as needed for cough, may use cough syrup at bedtime to allow for rest    You can take Tylenol and/or Ibuprofen as needed for fever reduction and pain relief.   For cough: honey 1/2 to 1 teaspoon (you can dilute the honey in water or another fluid).  You can also use guaifenesin and dextromethorphan for cough. You can use a humidifier for chest congestion and cough.  If you don't have a humidifier, you can sit in the bathroom with the hot shower running.      For sore throat: try warm salt water gargles, cepacol lozenges, throat spray, warm tea or water with lemon/honey, popsicles or ice, or OTC cold relief medicine for throat discomfort.   For congestion: take a daily anti-histamine like Zyrtec , Claritin, and a oral decongestant, such as pseudoephedrine.  You can also use Flonase 1-2 sprays in each nostril daily.   It is important to stay hydrated: drink plenty of fluids (water, gatorade/powerade/pedialyte, juices, or teas) to keep your throat moisturized and help further relieve irritation/discomfort.

## 2023-10-20 NOTE — ED Triage Notes (Signed)
 Patient presents to UC for cough, wheezing, SOB w/activity, and nasal congestion x 5 days. Treating with dayquil.

## 2023-11-21 ENCOUNTER — Encounter: Payer: Self-pay | Admitting: Nurse Practitioner

## 2023-11-21 ENCOUNTER — Ambulatory Visit (INDEPENDENT_AMBULATORY_CARE_PROVIDER_SITE_OTHER): Admitting: Nurse Practitioner

## 2023-11-21 ENCOUNTER — Encounter: Payer: 59 | Admitting: Nurse Practitioner

## 2023-11-21 VITALS — BP 118/62 | HR 71 | Temp 98.6°F | Ht 65.63 in | Wt 168.8 lb

## 2023-11-21 DIAGNOSIS — E7800 Pure hypercholesterolemia, unspecified: Secondary | ICD-10-CM | POA: Diagnosis not present

## 2023-11-21 DIAGNOSIS — E663 Overweight: Secondary | ICD-10-CM | POA: Diagnosis not present

## 2023-11-21 DIAGNOSIS — Z131 Encounter for screening for diabetes mellitus: Secondary | ICD-10-CM | POA: Diagnosis not present

## 2023-11-21 DIAGNOSIS — Z0000 Encounter for general adult medical examination without abnormal findings: Secondary | ICD-10-CM

## 2023-11-21 DIAGNOSIS — J4520 Mild intermittent asthma, uncomplicated: Secondary | ICD-10-CM

## 2023-11-21 DIAGNOSIS — R7989 Other specified abnormal findings of blood chemistry: Secondary | ICD-10-CM | POA: Insufficient documentation

## 2023-11-21 DIAGNOSIS — Z23 Encounter for immunization: Secondary | ICD-10-CM

## 2023-11-21 MED ORDER — ALBUTEROL SULFATE HFA 108 (90 BASE) MCG/ACT IN AERS: 2.0000 | INHALATION_SPRAY | Freq: Four times a day (QID) | RESPIRATORY_TRACT | 0 refills | Status: AC | PRN

## 2023-11-21 NOTE — Assessment & Plan Note (Signed)
 History of the same well-controlled exacerbates with illness per patient report albuterol  Hailer sent to pharmacy will update Prevnar 20 today

## 2023-11-21 NOTE — Assessment & Plan Note (Signed)
 Patient is exercising 3 times a week 40 minutes at a time.  He has lost weight since last year continue work on healthy lifestyle modifications

## 2023-11-21 NOTE — Assessment & Plan Note (Signed)
 Discussed age-appropriate immunization and screening exams.  Did review patient's personal, surgical, social, family history.  Patient is up-to-date on all age-appropriate vaccinations he would like.  Update Prevnar 20 today.  Patient is too young for CRC screening or prostate cancer screening.  Patient declined STI testing today.  Patient was given information at discharge about preventative healthcare maintenance with anticipatory guidance.

## 2023-11-21 NOTE — Assessment & Plan Note (Signed)
 History of the same.  Will check CBC today along with B12, folate, iron and ferritin.

## 2023-11-21 NOTE — Progress Notes (Signed)
 Established Patient Office Visit  Subjective   Patient ID: Chad Fernandez, male    DOB: 1995/07/05  Age: 28 y.o. MRN: [id number]  Chief Complaint  Patient presents with   Annual Exam    HPI  for complete physical and follow up of chronic conditions.  Asthma: states that he does not have an inhaler at home. States that he will have issues when he was sick.  He would like to have an inhaler at home.   Immunizations: -Tetanus: Completed in 2018 -Influenza: Out of season -Shingles: Too young -Pneumonia: Update today -HPV: Up-to-date  Diet: Fair diet. He is eating 2 meals and does not snack. He will drinks sports drinks and juice  Exercise:  3 times a week at . He does strength training and cardio  Eye exam: needs updating  Wears glasses  Dental exam: Completes semi-annually    Colonoscopy: Too young, currently average risk Lung Cancer Screening: N/A  PSA: Too young, currently average risk  STI: Does not want to be screened   Sleep: states that he is working night shift. He will get home on average around 930am and sleep for 4 hours then get up and then will sleep another 4 hours. Feels rested for the most part. He does not snore.       Review of Systems  Constitutional:  Negative for chills and fever.  Respiratory:  Negative for shortness of breath.   Cardiovascular:  Negative for chest pain and leg swelling.  Gastrointestinal:  Negative for abdominal pain, blood in stool, constipation, diarrhea, nausea and vomiting.       BM every other day   Genitourinary:  Negative for dysuria and hematuria.  Neurological:  Negative for dizziness, tingling and headaches.  Psychiatric/Behavioral:  Negative for hallucinations and suicidal ideas.       Objective:     BP 118/62   Pulse 71   Temp 98.6 F (37 C) (Oral)   Ht 5' 5.63 (1.667 m)   Wt 168 lb 12.8 oz (76.6 kg)   SpO2 99%   BMI 27.55 kg/m  BP Readings from Last 3 Encounters:  11/21/23 118/62  10/20/23  130/81  11/20/22 110/74   Wt Readings from Last 3 Encounters:  11/21/23 168 lb 12.8 oz (76.6 kg)  11/20/22 177 lb (80.3 kg)  04/07/22 199 lb (90.3 kg)   SpO2 Readings from Last 3 Encounters:  11/21/23 99%  10/20/23 98%  11/20/22 97%      Physical Exam Vitals and nursing note reviewed.  Constitutional:      Appearance: Normal appearance.  HENT:     Right Ear: Tympanic membrane, ear canal and external ear normal.     Left Ear: Tympanic membrane, ear canal and external ear normal.     Mouth/Throat:     Mouth: Mucous membranes are moist.     Pharynx: Oropharynx is clear.  Eyes:     Extraocular Movements: Extraocular movements intact.     Pupils: Pupils are equal, round, and reactive to light.  Cardiovascular:     Rate and Rhythm: Normal rate and regular rhythm.     Pulses: Normal pulses.     Heart sounds: Normal heart sounds.  Pulmonary:     Effort: Pulmonary effort is normal.     Breath sounds: Normal breath sounds.  Abdominal:     General: Bowel sounds are normal. There is no distension.     Palpations: There is no mass.     Tenderness: There is no  abdominal tenderness.     Hernia: No hernia is present.  Genitourinary:    Comments: deferred Musculoskeletal:     Right lower leg: No edema.     Left lower leg: No edema.  Lymphadenopathy:     Cervical: No cervical adenopathy.  Skin:    General: Skin is warm.  Neurological:     General: No focal deficit present.     Mental Status: He is alert.     Deep Tendon Reflexes:     Reflex Scores:      Bicep reflexes are 2+ on the right side and 2+ on the left side.      Patellar reflexes are 2+ on the right side and 2+ on the left side.    Comments: Bilateral upper and lower extremity strength 5/5  Psychiatric:        Mood and Affect: Mood normal.        Behavior: Behavior normal.        Thought Content: Thought content normal.        Judgment: Judgment normal.      No results found for any visits on  11/21/23.    The ASCVD Risk score (Arnett DK, et al., 2019) failed to calculate for the following reasons:   The 2019 ASCVD risk score is only valid for ages 24 to 64    Assessment & Plan:   Problem List Items Addressed This Visit       Respiratory   Asthma   History of the same well-controlled exacerbates with illness per patient report albuterol  Hailer sent to pharmacy will update Prevnar 20 today      Relevant Medications   albuterol  (VENTOLIN  HFA) 108 (90 Base) MCG/ACT inhaler     Other   Preventative health care - Primary   Discussed age-appropriate immunization and screening exams.  Did review patient's personal, surgical, social, family history.  Patient is up-to-date on all age-appropriate vaccinations he would like.  Update Prevnar 20 today.  Patient is too young for CRC screening or prostate cancer screening.  Patient declined STI testing today.  Patient was given information at discharge about preventative healthcare maintenance with anticipatory guidance.      Relevant Orders   CBC with Differential/Platelet   Comprehensive metabolic panel with GFR   TSH   Overweight   Patient is exercising 3 times a week 40 minutes at a time.  He has lost weight since last year continue work on healthy lifestyle modifications      Relevant Orders   Hemoglobin A1c   Lipid panel   Elevated LDL cholesterol level   History of the same pending lipid panel today      Relevant Orders   Hemoglobin A1c   Lipid panel   Abnormal CBC   History of the same.  Will check CBC today along with B12, folate, iron and ferritin.      Relevant Orders   CBC with Differential/Platelet   Vitamin B12   Folate   Ferritin   Iron and TIBC   Other Visit Diagnoses       Screening for diabetes mellitus       Relevant Orders   Hemoglobin A1c     Need for pneumococcal 20-valent conjugate vaccination       Relevant Orders   Pneumococcal conjugate vaccine 20-valent (Prevnar 20)       No  follow-ups on file.    Adina Crandall, NP

## 2023-11-21 NOTE — Patient Instructions (Signed)
 Nice to see you today I will be in touch with the labs once I have them Follow up with me in 1 year, sooner if you need me

## 2023-11-21 NOTE — Assessment & Plan Note (Signed)
 History of the same pending lipid panel today

## 2023-11-22 LAB — COMPREHENSIVE METABOLIC PANEL WITH GFR
ALT: 19 IU/L (ref 0–44)
AST: 22 IU/L (ref 0–40)
Albumin: 4.7 g/dL (ref 4.3–5.2)
Alkaline Phosphatase: 51 IU/L (ref 44–121)
BUN/Creatinine Ratio: 14 (ref 9–20)
BUN: 15 mg/dL (ref 6–20)
Bilirubin Total: 1.2 mg/dL (ref 0.0–1.2)
CO2: 23 mmol/L (ref 20–29)
Calcium: 10.1 mg/dL (ref 8.7–10.2)
Chloride: 101 mmol/L (ref 96–106)
Creatinine, Ser: 1.09 mg/dL (ref 0.76–1.27)
Globulin, Total: 2.7 g/dL (ref 1.5–4.5)
Glucose: 71 mg/dL (ref 70–99)
Potassium: 4.4 mmol/L (ref 3.5–5.2)
Sodium: 141 mmol/L (ref 134–144)
Total Protein: 7.4 g/dL (ref 6.0–8.5)
eGFR: 97 mL/min/1.73 (ref >59)

## 2023-11-22 LAB — CBC WITH DIFFERENTIAL/PLATELET
Basophils Absolute: 0 x10E3/uL (ref 0.0–0.2)
Basos: 1 %
EOS (ABSOLUTE): 0.1 x10E3/uL (ref 0.0–0.4)
Eos: 1 %
Hematocrit: 45 % (ref 37.5–51.0)
Hemoglobin: 13.6 g/dL (ref 13.0–17.7)
Immature Grans (Abs): 0 x10E3/uL (ref 0.0–0.1)
Immature Granulocytes: 0 %
Lymphocytes Absolute: 2.2 x10E3/uL (ref 0.7–3.1)
Lymphs: 46 %
MCH: 22.8 pg — ABNORMAL LOW (ref 26.6–33.0)
MCHC: 30.2 g/dL — ABNORMAL LOW (ref 31.5–35.7)
MCV: 75 fL — ABNORMAL LOW (ref 79–97)
Monocytes Absolute: 0.5 x10E3/uL (ref 0.1–0.9)
Monocytes: 12 %
Neutrophils Absolute: 1.8 x10E3/uL (ref 1.4–7.0)
Neutrophils: 40 %
Platelets: 261 x10E3/uL (ref 150–450)
RBC: 5.97 x10E6/uL — ABNORMAL HIGH (ref 4.14–5.80)
RDW: 15.1 % (ref 11.6–15.4)
WBC: 4.6 x10E3/uL (ref 3.4–10.8)

## 2023-11-22 LAB — HEMOGLOBIN A1C
Est. average glucose Bld gHb Est-mCnc: 108 mg/dL
Hgb A1c MFr Bld: 5.4 % (ref 4.8–5.6)

## 2023-11-22 LAB — LIPID PANEL
Chol/HDL Ratio: 4 ratio (ref 0.0–5.0)
Cholesterol, Total: 222 mg/dL — ABNORMAL HIGH (ref 100–199)
HDL: 55 mg/dL (ref >39)
LDL Chol Calc (NIH): 159 mg/dL — ABNORMAL HIGH (ref 0–99)
Triglycerides: 47 mg/dL (ref 0–149)
VLDL Cholesterol Cal: 8 mg/dL (ref 5–40)

## 2023-11-22 LAB — IRON AND TIBC
Iron Saturation: 16 % (ref 15–55)
Iron: 55 ug/dL (ref 38–169)
Total Iron Binding Capacity: 338 ug/dL (ref 250–450)
UIBC: 283 ug/dL (ref 111–343)

## 2023-11-22 LAB — VITAMIN B12: Vitamin B-12: 458 pg/mL (ref 232–1245)

## 2023-11-22 LAB — FERRITIN: Ferritin: 355 ng/mL (ref 30–400)

## 2023-11-22 LAB — TSH: TSH: 1.17 u[IU]/mL (ref 0.450–4.500)

## 2023-11-22 LAB — FOLATE: Folate: 9.9 ng/mL (ref >3.0)

## 2023-11-27 ENCOUNTER — Ambulatory Visit: Payer: Self-pay | Admitting: Nurse Practitioner

## 2024-04-02 ENCOUNTER — Ambulatory Visit
Admission: EM | Admit: 2024-04-02 | Discharge: 2024-04-02 | Disposition: A | Discharge status: Home / Self Care | Attending: Emergency Medicine | Admitting: Emergency Medicine

## 2024-04-02 DIAGNOSIS — M79674 Pain in right toe(s): Secondary | ICD-10-CM | POA: Diagnosis not present

## 2024-04-02 MED ORDER — COLCHICINE 0.6 MG PO TABS: ORAL_TABLET | ORAL | 0 refills | Status: AC

## 2024-04-02 NOTE — ED Triage Notes (Signed)
 Patient to Urgent Care with complaints of right sided, great toe swelling and redness.   No injury. Symptoms started during the night.  Taking ibuprofen.

## 2024-04-02 NOTE — Discharge Instructions (Addendum)
 Take the colchicine as directed.  Follow-up with your primary care provider.  Go to the emergency department if you have worsening symptoms.

## 2024-04-02 NOTE — ED Provider Notes (Signed)
 Chad Fernandez    CSN: [id number] Arrival date & time: 04/02/24  1736      History   Chief Complaint Chief Complaint  Patient presents with   Foot Pain    HPI Chad Fernandez is a 28 y.o. male.  Patient presents with right great toe pain, redness, swelling since this morning.  He woke up with his symptoms.  No trauma.  No wounds, fever, numbness, weakness.  He took ibuprofen; last dose taken 7 hours ago.  Patient is concerned for gout.  The history is provided by the patient and medical records.    Past Medical History:  Diagnosis Date   Asthma     Patient Active Problem List   Diagnosis Date Noted   Abnormal CBC 11/21/2023   Overweight 11/20/2022   Elevated LDL cholesterol level 11/20/2022   Ingrown toenail of left foot 04/07/2022   Scrotal mass 11/14/2021   Class 1 obesity due to excess calories without serious comorbidity with body mass index (BMI) of 34.0 to 34.9 in adult 11/14/2021   Preventative health care 11/14/2021   Asthma 05/27/2009   Allergic rhinitis 04/27/2009    Past Surgical History:  Procedure Laterality Date   NO PAST SURGERIES         Home Medications    Prior to Admission medications   Medication Sig Start Date End Date Taking? Authorizing Provider  colchicine  0.6 MG tablet Take 2 tablets by mouth now.  Then take 1 tablet by mouth one hour later. 04/02/24  Yes Corlis Burnard DEL, NP  albuterol  (VENTOLIN  HFA) 108 (90 Base) MCG/ACT inhaler Inhale 2 puffs into the lungs every 6 (six) hours as needed for wheezing or shortness of breath. 11/21/23   Wendee Lynwood HERO, NP    Family History Family History  Problem Relation Age of Onset   Obesity Mother    Anemia Mother    Asthma Father    Obesity Father    Diabetes Paternal Grandmother    Cataracts Paternal Grandmother     Social History Social History   Tobacco Use   Smoking status: Never    Passive exposure: Never   Smokeless tobacco: Never  Vaping Use   Vaping status: Never Used   Substance Use Topics   Alcohol use: Never   Drug use: Never     Allergies   Patient has no known allergies.   Review of Systems Review of Systems  Constitutional:  Negative for chills and fever.  Musculoskeletal:  Positive for arthralgias and joint swelling. Negative for gait problem.  Skin:  Positive for color change. Negative for wound.  Neurological:  Negative for weakness and numbness.     Physical Exam Triage Vital Signs ED Triage Vitals  Encounter Vitals Group     BP 04/02/24 1755 127/83     Girls Systolic BP Percentile --      Girls Diastolic BP Percentile --      Boys Systolic BP Percentile --      Boys Diastolic BP Percentile --      Pulse Rate 04/02/24 1755 83     Resp 04/02/24 1755 18     Temp 04/02/24 1755 97.8 F (36.6 C)     Temp src --      SpO2 04/02/24 1755 98 %     Weight --      Height --      Head Circumference --      Peak Flow --      Pain Score  04/02/24 1758 8     Pain Loc --      Pain Education --      Exclude from Growth Chart --    No data found.  Updated Vital Signs BP 127/83   Pulse 83   Temp 97.8 F (36.6 C)   Resp 18   SpO2 98%   Visual Acuity Right Eye Distance:   Left Eye Distance:   Bilateral Distance:    Right Eye Near:   Left Eye Near:    Bilateral Near:     Physical Exam Constitutional:      General: He is not in acute distress. HENT:     Mouth/Throat:     Mouth: Mucous membranes are moist.  Cardiovascular:     Rate and Rhythm: Normal rate.  Pulmonary:     Effort: Pulmonary effort is normal. No respiratory distress.  Musculoskeletal:        General: Swelling and tenderness present. No deformity. Normal range of motion.       Feet:  Skin:    General: Skin is warm and dry.     Capillary Refill: Capillary refill takes less than 2 seconds.     Findings: Erythema present. No lesion.  Neurological:     General: No focal deficit present.     Mental Status: He is alert.     Sensory: No sensory deficit.      Motor: No weakness.     Gait: Gait normal.      UC Treatments / Results  Labs (all labs ordered are listed, but only abnormal results are displayed) Labs Reviewed - No data to display  EKG   Radiology No results found.  Procedures Procedures (including critical care time)  Medications Ordered in UC Medications - No data to display  Initial Impression / Assessment and Plan / UC Course  I have reviewed the triage vital signs and the nursing notes.  Pertinent labs & imaging results that were available during my care of the patient were reviewed by me and considered in my medical decision making (see chart for details).    Right great toe pain.  No trauma.  Afebrile and vital signs are stable.  Treating today with colchicine.  Instructed patient to avoid OTC medications for the rest of today.  Instructed him to follow-up with his PCP.  Education provided on gout.  ED precautions given.  He agrees to plan of care.  Final Clinical Impressions(s) / UC Diagnoses   Final diagnoses:  Great toe pain, right     Discharge Instructions      Take the colchicine as directed.  Follow-up with your primary care provider.  Go to the emergency department if you have worsening symptoms.     ED Prescriptions     Medication Sig Dispense Auth. Provider   colchicine 0.6 MG tablet Take 2 tablets by mouth now.  Then take 1 tablet by mouth one hour later. 3 tablet Corlis Burnard DEL, NP      PDMP not reviewed this encounter.   Corlis Burnard DEL, NP 04/02/24 1850

## 2024-05-05 ENCOUNTER — Encounter: Admitting: Nurse Practitioner

## 2024-11-24 ENCOUNTER — Encounter: Admitting: Nurse Practitioner
